# Patient Record
Sex: Female | Born: 1970 | Hispanic: No | Marital: Married | Smoking: Never smoker
Health system: Southern US, Community
[De-identification: ages and names within clinical notes are randomized; demographics above are authoritative.]

## PROBLEM LIST (undated history)

## (undated) DIAGNOSIS — E7800 Pure hypercholesterolemia, unspecified: Secondary | ICD-10-CM

## (undated) HISTORY — PX: MANDIBLE FRACTURE SURGERY: SHX706

## (undated) HISTORY — PX: KNEE SURGERY: SHX244

---

## 1998-06-21 ENCOUNTER — Ambulatory Visit (HOSPITAL_COMMUNITY): Admission: RE | Admit: 1998-06-21 | Discharge: 1998-06-21 | Payer: Self-pay | Admitting: Obstetrics & Gynecology

## 1998-06-28 ENCOUNTER — Ambulatory Visit (HOSPITAL_COMMUNITY): Admission: RE | Admit: 1998-06-28 | Discharge: 1998-06-28 | Payer: Self-pay | Admitting: Obstetrics & Gynecology

## 1999-02-13 ENCOUNTER — Other Ambulatory Visit: Admission: RE | Admit: 1999-02-13 | Discharge: 1999-02-13 | Payer: Self-pay | Admitting: Obstetrics & Gynecology

## 2001-09-30 ENCOUNTER — Other Ambulatory Visit: Admission: RE | Admit: 2001-09-30 | Discharge: 2001-09-30 | Payer: Self-pay | Admitting: Obstetrics & Gynecology

## 2002-12-16 ENCOUNTER — Inpatient Hospital Stay (HOSPITAL_COMMUNITY): Admission: AD | Admit: 2002-12-16 | Discharge: 2002-12-16 | Payer: Self-pay | Admitting: Obstetrics & Gynecology

## 2003-01-04 ENCOUNTER — Encounter (INDEPENDENT_AMBULATORY_CARE_PROVIDER_SITE_OTHER): Payer: Self-pay | Admitting: Specialist

## 2003-01-04 ENCOUNTER — Inpatient Hospital Stay (HOSPITAL_COMMUNITY): Admission: AD | Admit: 2003-01-04 | Discharge: 2003-01-08 | Payer: Self-pay | Admitting: Obstetrics and Gynecology

## 2003-02-16 ENCOUNTER — Other Ambulatory Visit: Admission: RE | Admit: 2003-02-16 | Discharge: 2003-02-16 | Payer: Self-pay | Admitting: Obstetrics and Gynecology

## 2007-01-22 ENCOUNTER — Other Ambulatory Visit: Admission: RE | Admit: 2007-01-22 | Discharge: 2007-01-22 | Payer: Self-pay | Admitting: *Deleted

## 2008-01-19 ENCOUNTER — Other Ambulatory Visit: Admission: RE | Admit: 2008-01-19 | Discharge: 2008-01-19 | Payer: Self-pay | Admitting: Family Medicine

## 2011-01-25 NOTE — Discharge Summary (Signed)
   Lindsay Kelly, DESCOTEAUX NO.:  [id number]   MEDICAL RECORD NO.:  [id number]                   PATIENT TYPE:  INP   LOCATION:  9124                                 FACILITY:  [facility]   PHYSICIAN:  Carrington Clamp, M.D.              DATE OF BIRTH:  08-May-1971   DATE OF ADMISSION:  01/04/2003  DATE OF DISCHARGE:  01/08/2003                                 DISCHARGE SUMMARY   FINAL DIAGNOSES:  1. Intrauterine pregnancy at 30-2/[redacted] weeks gestation.  2. Preterm labor.  3. Preterm premature rupture of membranes.  4. Double footling breech presentation.   PROCEDURE:  Classical cesarean section.  Surgeon: Miguel Aschoff, M.D.   COMPLICATIONS:  None.   HISTORY:  This 40 year old, G2, P0-0-1-0 presents to the [facility] on  April 27 with discharge, spotting, and pressure.  The patient was noted to  have spontaneous rupture of membranes and was found to have both feet  presenting in the vagina through the cervix which appeared to be about 6 cm  dilated.  The patient's antepartum course up to this point had been  uncomplicated.  She did have a non-immune rubella status, and she was Rh  negative and did receive her RhoGAM at 28 weeks.  Otherwise, her antepartum  course had been uncomplicated up to this point.   HOSPITAL COURSE:  She was taken for an immediate cesarean section.  The  patient was taken to the operating room on January 04, 2003, by Dr. Miguel Aschoff  where a classical cesarean section was performed with delivery of a 3 pound  8 ounce female infant with Apgars of 6 and 7.  Of course, NICU was at  delivery, and the baby was taken immediately to the NICU.   The patient's postoperative course was benign without significant fevers.  The patient did receive rubella vaccine during her hospital course and  RhoGAM.  The baby was in the NICU, stable and doing well, and she was  thought ready for discharge on postoperative day #3.   She was sent home on a regular  diet, told to decrease activities, told to  continue prenatal vitamins.  She was given a prescription for Percocet 1 to  2 every 4 hours as needed for pain, told she could use ibuprofen every 6  hours as needed, and follow up in the office in two weeks.  Of course, the  patient was to call for any fevers or pain.   DISCHARGE LABORATORY DATA:  The patient had a hemoglobin of 10.3 and  hematocrit 30.7 upon discharge.     Leilani Able, P.A.-C.                Carrington Clamp, M.D.    MB/MEDQ  D:  02/03/2003  T:  02/03/2003  Job:  [id number]

## 2011-01-25 NOTE — Op Note (Signed)
NAME:  Lindsay Kelly, HOHEISEL NO.:  [id number]   MEDICAL RECORD NO.:  [id number]                   PATIENT TYPE:  INP   LOCATION:  9124                                 FACILITY:  [facility]   PHYSICIAN:  Miguel Aschoff, M.D.                    DATE OF BIRTH:  1971-04-17   DATE OF PROCEDURE:  01/04/2003  DATE OF DISCHARGE:                                 OPERATIVE REPORT   PREOPERATIVE DIAGNOSES:  Intrauterine pregnancy at 30 and 2/7 weeks, preterm  labor, preterm rupture of membranes, double footling breech.   POSTOPERATIVE DIAGNOSES:  Intrauterine pregnancy at 30 and 2/7 weeks,  preterm labor, preterm rupture of membranes, double footling breech,  delivery of viable female infant.   PROCEDURE:  Classical Cesarean section.   SURGEON:  Miguel Aschoff, M.D.   ANESTHESIA:  Spinal.   COMPLICATIONS:  None.   JUSTIFICATION:  The patient is a 40 year-old white female gravida 2, para 0-  0-1-0 with an estimated date of confinement of 03/13/2003 who presented to  the [facility] with onset of lower abdominal pain, spotting and  discharge.  She was placed on monitor and was noted to be having regular  contractions.  On examination was found to have both feet presenting in the  vagina through a cervix which appeared to be approximately 6 cm dilated.  In  view of the preterm labor and preterm rupture of membranes, she was taken  for immediate Cesarean section.   DESCRIPTION OF PROCEDURE:  The patient was taken to the operating room and  placed in the sitting position.  Spinal anesthesia was administered without  difficulty.  She was then placed in supine position, deviated to the left  and prepped and draped in usual sterile fashion.  Pfannenstiel incision was  then made after a Foley catheter had been previously placed.  The incision  was extended down through the subcutaneous tissue with bleeding points being  clamped and then coagulated as they were encountered.  The  fascia was then  identified and incised transversely.  It was then separated from the  underlying rectus muscles.  Rectus muscles were divided in the midline.  Peritoneum was then identified and entered carefully avoiding underlying  structures.  Once this was done palpation was carried out.  It appeared that  the head and chest were in the fundus.  There appeared to be a contraction  band.  The buttocks were at the cervix and the feet were through the cervix.  In view of the bulk of the fetus being in the fundus and contraction band,  it was elected to proceed with a low vertical incision on the uterus.  Because of the contraction band and the inability to extract the fetus, the  incision was extended up to a classical incision with delivery of a viable  female infant.  The Apgars pending.  The neonatologists were in  attendance.  After delivery of the baby, the baby was handed to the neonatal team in  attendance.  Cord bloods were then obtained.  The placenta was delivered and  the uterus was closed in several layers.  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  Most of these were between 5.0 mm  and 2.0 cm in size but there were a multitude of these small fibroids  present.  Using #1 Vicryl the uterus was closed in three layers and then the  parietal peritoneum was reapproximated using running continuous 2-0 Vicryl  suture.  Hemostasis appeared to be excellent.  At this point, lap counts and  instrument counts were taken and found to be correct.  The tubes and ovaries  were inspected and noted to be within normal limits.  The abdomen was  irrigated with normal saline and then the abdomen was closed.  The parietal  peritoneum was closed using running continuous 2-0 Vicryl suture.  Rectus  muscles were reapproximated using running continuous 2-0 Vicryl suture.  The  fascia was closed using two sutures of 0 Vicryl starting at the lateral  fascial angles and  meeting in the midline.  The subcutaneous tissue and skin  were closed using staples.  The estimated blood loss was approximately 600  cc.  The patient tolerated the procedure well and went to the recovery room  in satisfactory condition.  The baby was taken to the Neonatal Intensive  Care Unit in satisfactory condition.                                               Miguel Aschoff, M.D.    AR/MEDQ  D:  01/05/2003  T:  01/05/2003  Job:  [id number]

## 2012-07-09 ENCOUNTER — Other Ambulatory Visit (HOSPITAL_COMMUNITY)
Admission: RE | Admit: 2012-07-09 | Discharge: 2012-07-09 | Disposition: A | Discharge status: Home / Self Care | Payer: 59 | Source: Ambulatory Visit | Attending: Family Medicine | Admitting: Family Medicine

## 2012-07-09 ENCOUNTER — Other Ambulatory Visit: Payer: Self-pay | Admitting: Physician Assistant

## 2012-07-09 DIAGNOSIS — Z124 Encounter for screening for malignant neoplasm of cervix: Secondary | ICD-10-CM | POA: Insufficient documentation

## 2013-07-12 ENCOUNTER — Other Ambulatory Visit (HOSPITAL_COMMUNITY)
Admission: RE | Admit: 2013-07-12 | Discharge: 2013-07-12 | Disposition: A | Discharge status: Home / Self Care | Payer: 59 | Source: Ambulatory Visit | Attending: Family Medicine | Admitting: Family Medicine

## 2013-07-12 ENCOUNTER — Other Ambulatory Visit: Payer: Self-pay | Admitting: Physician Assistant

## 2013-07-12 ENCOUNTER — Other Ambulatory Visit: Payer: Self-pay

## 2013-07-12 DIAGNOSIS — Z124 Encounter for screening for malignant neoplasm of cervix: Secondary | ICD-10-CM | POA: Insufficient documentation

## 2013-07-12 DIAGNOSIS — Z1231 Encounter for screening mammogram for malignant neoplasm of breast: Secondary | ICD-10-CM

## 2013-08-10 ENCOUNTER — Ambulatory Visit
Admission: RE | Admit: 2013-08-10 | Discharge: 2013-08-10 | Disposition: A | Discharge status: Home / Self Care | Payer: 59 | Source: Ambulatory Visit

## 2013-08-10 DIAGNOSIS — Z1231 Encounter for screening mammogram for malignant neoplasm of breast: Secondary | ICD-10-CM

## 2013-08-11 ENCOUNTER — Other Ambulatory Visit: Payer: Self-pay | Admitting: Physician Assistant

## 2013-08-11 DIAGNOSIS — R928 Other abnormal and inconclusive findings on diagnostic imaging of breast: Secondary | ICD-10-CM

## 2013-08-18 ENCOUNTER — Ambulatory Visit
Admission: RE | Admit: 2013-08-18 | Discharge: 2013-08-18 | Disposition: A | Discharge status: Home / Self Care | Payer: 59 | Source: Ambulatory Visit | Attending: Physician Assistant | Admitting: Physician Assistant

## 2013-08-18 DIAGNOSIS — R928 Other abnormal and inconclusive findings on diagnostic imaging of breast: Secondary | ICD-10-CM

## 2014-07-11 ENCOUNTER — Other Ambulatory Visit: Payer: Self-pay

## 2014-07-11 DIAGNOSIS — Z1231 Encounter for screening mammogram for malignant neoplasm of breast: Secondary | ICD-10-CM

## 2014-07-15 ENCOUNTER — Other Ambulatory Visit (HOSPITAL_COMMUNITY)
Admission: RE | Admit: 2014-07-15 | Discharge: 2014-07-15 | Disposition: A | Discharge status: Home / Self Care | Payer: 59 | Source: Ambulatory Visit | Attending: Family Medicine | Admitting: Family Medicine

## 2014-07-15 ENCOUNTER — Other Ambulatory Visit: Payer: Self-pay | Admitting: Physician Assistant

## 2014-07-15 DIAGNOSIS — Z124 Encounter for screening for malignant neoplasm of cervix: Secondary | ICD-10-CM | POA: Diagnosis present

## 2014-07-19 LAB — CYTOLOGY - PAP

## 2014-08-11 ENCOUNTER — Ambulatory Visit
Admission: RE | Admit: 2014-08-11 | Discharge: 2014-08-11 | Disposition: A | Discharge status: Home / Self Care | Payer: 59 | Source: Ambulatory Visit

## 2014-08-11 DIAGNOSIS — Z1231 Encounter for screening mammogram for malignant neoplasm of breast: Secondary | ICD-10-CM

## 2015-08-14 ENCOUNTER — Other Ambulatory Visit: Payer: Self-pay

## 2015-08-14 DIAGNOSIS — Z1231 Encounter for screening mammogram for malignant neoplasm of breast: Secondary | ICD-10-CM

## 2015-08-18 ENCOUNTER — Ambulatory Visit
Admission: RE | Admit: 2015-08-18 | Discharge: 2015-08-18 | Disposition: A | Discharge status: Home / Self Care | Payer: Commercial Managed Care - HMO | Source: Ambulatory Visit

## 2015-08-18 DIAGNOSIS — Z1231 Encounter for screening mammogram for malignant neoplasm of breast: Secondary | ICD-10-CM

## 2016-08-21 ENCOUNTER — Other Ambulatory Visit: Payer: Self-pay | Admitting: Physician Assistant

## 2016-08-21 DIAGNOSIS — Z1231 Encounter for screening mammogram for malignant neoplasm of breast: Secondary | ICD-10-CM

## 2016-09-23 ENCOUNTER — Ambulatory Visit
Admission: RE | Admit: 2016-09-23 | Discharge: 2016-09-23 | Disposition: A | Discharge status: Home / Self Care | Payer: Commercial Managed Care - HMO | Source: Ambulatory Visit | Attending: Physician Assistant | Admitting: Physician Assistant

## 2016-09-23 DIAGNOSIS — Z1231 Encounter for screening mammogram for malignant neoplasm of breast: Secondary | ICD-10-CM | POA: Diagnosis not present

## 2017-08-28 ENCOUNTER — Other Ambulatory Visit: Payer: Self-pay | Admitting: Physician Assistant

## 2017-08-28 DIAGNOSIS — Z1231 Encounter for screening mammogram for malignant neoplasm of breast: Secondary | ICD-10-CM

## 2017-09-26 ENCOUNTER — Ambulatory Visit
Admission: RE | Admit: 2017-09-26 | Discharge: 2017-09-26 | Disposition: A | Discharge status: Home / Self Care | Payer: BC Managed Care – PPO | Source: Ambulatory Visit | Attending: Physician Assistant | Admitting: Physician Assistant

## 2017-09-26 DIAGNOSIS — Z1231 Encounter for screening mammogram for malignant neoplasm of breast: Secondary | ICD-10-CM

## 2017-10-09 ENCOUNTER — Other Ambulatory Visit: Payer: Self-pay | Admitting: Physician Assistant

## 2017-10-09 ENCOUNTER — Other Ambulatory Visit (HOSPITAL_COMMUNITY)
Admission: RE | Admit: 2017-10-09 | Discharge: 2017-10-09 | Disposition: A | Discharge status: Home / Self Care | Payer: BC Managed Care – PPO | Source: Ambulatory Visit | Attending: Physician Assistant | Admitting: Physician Assistant

## 2017-10-09 DIAGNOSIS — Z124 Encounter for screening for malignant neoplasm of cervix: Secondary | ICD-10-CM | POA: Insufficient documentation

## 2017-10-15 LAB — CYTOLOGY - PAP
Diagnosis: UNDETERMINED — AB
HPV: NOT DETECTED

## 2018-10-30 DIAGNOSIS — S02609A Fracture of mandible, unspecified, initial encounter for closed fracture: Secondary | ICD-10-CM | POA: Insufficient documentation

## 2020-04-11 ENCOUNTER — Other Ambulatory Visit: Payer: Self-pay | Admitting: Physician Assistant

## 2020-04-11 DIAGNOSIS — Z1231 Encounter for screening mammogram for malignant neoplasm of breast: Secondary | ICD-10-CM

## 2020-04-17 ENCOUNTER — Ambulatory Visit
Admission: RE | Admit: 2020-04-17 | Discharge: 2020-04-17 | Disposition: A | Discharge status: Home / Self Care | Payer: BC Managed Care – PPO | Source: Ambulatory Visit | Attending: Physician Assistant | Admitting: Physician Assistant

## 2020-04-17 ENCOUNTER — Other Ambulatory Visit: Payer: Self-pay

## 2020-04-17 DIAGNOSIS — Z1231 Encounter for screening mammogram for malignant neoplasm of breast: Secondary | ICD-10-CM

## 2020-10-26 ENCOUNTER — Other Ambulatory Visit: Payer: Self-pay | Admitting: Sports Medicine

## 2020-10-26 ENCOUNTER — Ambulatory Visit (INDEPENDENT_AMBULATORY_CARE_PROVIDER_SITE_OTHER): Payer: 59

## 2020-10-26 ENCOUNTER — Other Ambulatory Visit: Payer: Self-pay

## 2020-10-26 ENCOUNTER — Ambulatory Visit (INDEPENDENT_AMBULATORY_CARE_PROVIDER_SITE_OTHER): Payer: 59 | Admitting: Sports Medicine

## 2020-10-26 ENCOUNTER — Encounter: Payer: Self-pay | Admitting: Sports Medicine

## 2020-10-26 DIAGNOSIS — M6740 Ganglion, unspecified site: Secondary | ICD-10-CM

## 2020-10-26 DIAGNOSIS — M79672 Pain in left foot: Secondary | ICD-10-CM

## 2020-10-26 DIAGNOSIS — M67472 Ganglion, left ankle and foot: Secondary | ICD-10-CM | POA: Diagnosis not present

## 2020-10-26 DIAGNOSIS — E7800 Pure hypercholesterolemia, unspecified: Secondary | ICD-10-CM | POA: Insufficient documentation

## 2020-10-26 DIAGNOSIS — M7989 Other specified soft tissue disorders: Secondary | ICD-10-CM

## 2020-10-26 NOTE — Progress Notes (Signed)
Subjective: Lindsay Kelly is a 50 y.o. female patient who presents to office for evaluation of Left foot pain. Patient complains of irritation with shoes at the left foot and that her husband noticed it back in Dec. Patient denies any other pedal complaints. Denies injury/trip/fall/sprain/any causative factors.   Review of Systems  All other systems reviewed and are negative.    There are no problems to display for this patient.   No current outpatient medications on file prior to visit.   No current facility-administered medications on file prior to visit.    No Known Allergies  Objective:  General: Alert and oriented x3 in no acute distress  Dermatology: Raised mobile soft tissue mass at dorsolateral left foot that measures 1x2cm, No open lesions bilateral lower extremities, no webspace macerations, no ecchymosis bilateral, all nails x 10 are well manicured.  Vascular: Dorsalis Pedis and Posterior Tibial pedal pulses palpable, Capillary Fill Time 3 seconds,(+) pedal hair growth bilateral, no edema bilateral lower extremities, Temperature gradient within normal limits.  Neurology: Michaell Cowing sensation intact via light touch bilateral, vibratory intact bilateral.   Musculoskeletal: Minimal tenderness with palpation at dorsal midfoot on the left, No other symptomatic pedal deformities, Strength within normal limits in all groups bilateral.   Gait: Antalgic gait  Xrays  Left Foot   Impression:No acute osseous findings at area of concern.  Assessment and Plan: Problem List Items Addressed This Visit   None   Visit Diagnoses    Pain in left foot    -  Primary   Relevant Orders   DG Foot Complete Left   Soft tissue mass       Ganglion           -Complete examination performed -Xrays reviewed -Discussed treatment options -Patient elects for aspiration. After sterile prep, local anesthestic was administered to the area using 3cc 1% lidocaine and 0.5% marcaine plain after  anesthesia was confirmed using a 18g need aspirated 1cc of clear fluid from the mass/cyst and then injected 0.5cc of dexamethasone and then dressed with antibioitic cream and coban compressive dressing, patient to do the same daily for the next 3 days until site is healed. Patient tolerated the aspiration well without issues.  -Continue with good supportive shoes daily -Patient to return to office 2 weeks for site check or sooner if condition worsens.  Asencion Islam, DPM

## 2020-11-09 ENCOUNTER — Other Ambulatory Visit: Payer: Self-pay

## 2020-11-09 ENCOUNTER — Encounter: Payer: Self-pay | Admitting: Sports Medicine

## 2020-11-09 ENCOUNTER — Ambulatory Visit: Payer: 59 | Admitting: Sports Medicine

## 2020-11-09 DIAGNOSIS — M79672 Pain in left foot: Secondary | ICD-10-CM

## 2020-11-09 DIAGNOSIS — M7989 Other specified soft tissue disorders: Secondary | ICD-10-CM

## 2020-11-09 DIAGNOSIS — M6740 Ganglion, unspecified site: Secondary | ICD-10-CM

## 2020-11-09 NOTE — Progress Notes (Signed)
Subjective: Lindsay Kelly is a 50 y.o. female patient seen today in office for aspiration site check.  Patient had an office aspiration on 10/26/2020 of cyst at the dorsal lateral left foot.  Patient reports that there is no pain the cyst has slowly come back but not as big as before and only causes pain with wearing certain shoes.  No other issues noted.   Patient Active Problem List   Diagnosis Date Noted  . Pure hypercholesterolemia 10/26/2020  . Mandible fracture (HCC) 10/30/2018    Current Outpatient Medications on File Prior to Visit  Medication Sig Dispense Refill  . acetaminophen (TYLENOL) 500 MG tablet Take by mouth.    . fluticasone (FLONASE) 50 MCG/ACT nasal spray 1 spray in each nostril     No current facility-administered medications on file prior to visit.    No Known Allergies  Objective: There were no vitals filed for this visit.  General: No acute distress, AAOx3  Left foot previous puncture wound has well healed no warmth no redness no erythema no drainage no signs of infection there is still a small cyst noted at the dorsal lateral left foot that is well circumcised suggestive of reaccumulation and likely that this is may be related from the tendon, no limitation with range of motion neurovascular status intact.   Assessment and Plan:  Problem List Items Addressed This Visit   None   Visit Diagnoses    Soft tissue mass    -  Primary   Ganglion       Pain in left foot           -Patient seen and evaluated -Previous aspiration site on left foot well-healed -Advised patient to continue to monitor cyst if reaccumulate then may benefit from surgical excision -May also use Coban for gentle compression as directed -Return to office as needed or sooner if problem recurs or worsens  Asencion Islam, DPM

## 2021-03-29 ENCOUNTER — Other Ambulatory Visit: Payer: Self-pay | Admitting: Physician Assistant

## 2021-03-29 DIAGNOSIS — Z1231 Encounter for screening mammogram for malignant neoplasm of breast: Secondary | ICD-10-CM

## 2021-04-18 ENCOUNTER — Ambulatory Visit
Admission: RE | Admit: 2021-04-18 | Discharge: 2021-04-18 | Disposition: A | Discharge status: Home / Self Care | Payer: 59 | Source: Ambulatory Visit | Attending: Physician Assistant | Admitting: Physician Assistant

## 2021-04-18 ENCOUNTER — Other Ambulatory Visit: Payer: Self-pay

## 2021-04-18 DIAGNOSIS — Z1231 Encounter for screening mammogram for malignant neoplasm of breast: Secondary | ICD-10-CM

## 2021-12-28 ENCOUNTER — Ambulatory Visit (HOSPITAL_COMMUNITY)
Admission: EM | Admit: 2021-12-28 | Discharge: 2021-12-28 | Disposition: A | Discharge status: Home / Self Care | Payer: 59 | Attending: Nurse Practitioner | Admitting: Nurse Practitioner

## 2021-12-28 ENCOUNTER — Encounter (HOSPITAL_COMMUNITY): Payer: Self-pay | Admitting: Emergency Medicine

## 2021-12-28 DIAGNOSIS — S80862A Insect bite (nonvenomous), left lower leg, initial encounter: Secondary | ICD-10-CM | POA: Diagnosis not present

## 2021-12-28 DIAGNOSIS — W57XXXA Bitten or stung by nonvenomous insect and other nonvenomous arthropods, initial encounter: Secondary | ICD-10-CM | POA: Diagnosis not present

## 2021-12-28 MED ORDER — AMOXICILLIN-POT CLAVULANATE 875-125 MG PO TABS: 1.0000 | ORAL_TABLET | Freq: Two times a day (BID) | ORAL | 0 refills | Status: AC

## 2021-12-28 NOTE — ED Triage Notes (Signed)
Pt reports an insect bite on Sunday. States she was bitten by what she thought was a [employer]". States the bite has been swollen, inflamed and "hard" also reports painful to touch.  ?

## 2021-12-28 NOTE — Discharge Instructions (Addendum)
-   I do not appreciate any signs of infection on your exam today ?-If you start developing signs of infection like warmth, increased erythema, swelling, please start the Augmentin (antibiotic). ?- If your symptoms worsen despite this treatment, please follow up with primary care or here in urgent care ?

## 2021-12-28 NOTE — ED Provider Notes (Signed)
?[facility] ? ? ? ?CSN: [id number] ?Arrival date & time: 12/28/21  1658 ? ? ?  ? ?History   ?Chief Complaint ?Chief Complaint  ?Patient presents with  ? Insect Bite  ? ? ?HPI ?Lindsay Kelly is a 51 y.o. female.  ? ?Patient presents with a red and painful area to her left posterior calf that has been present since Sunday.  She reports initially, she thought it was a chigger bite because it was itchy and weeping a little bit.  However, she does not have any other bites in that area is no longer itching.  She reports area is red in the middle of it is firm.  She denies any current itching, swelling, drainage.  Denies fevers, nausea/vomiting.  She has been using over-the-counter hydrocortisone ointment and covering it with a Band-Aid. ? ?She was out in the yard gardening over the weekend, however never saw a bug on her skin. ? ? ?History reviewed. No pertinent past medical history. ? ?Patient Active Problem List  ? Diagnosis Date Noted  ? Pure hypercholesterolemia 10/26/2020  ? Mandible fracture (HCC) 10/30/2018  ? ? ?History reviewed. No pertinent surgical history. ? ?OB History   ?No obstetric history on file. ?  ? ? ? ?Home Medications   ? ?Prior to Admission medications   ?Medication Sig Start Date End Date Taking? Authorizing Provider  ?amoxicillin-clavulanate (AUGMENTIN) 875-125 MG tablet Take 1 tablet by mouth 2 (two) times daily for 7 days. 12/28/21 01/04/22 Yes Valentino Nose, NP  ?acetaminophen (TYLENOL) 500 MG tablet Take by mouth.    [provider]  ?fluticasone Aleda Grana) 50 MCG/ACT nasal spray 1 spray in each nostril 06/07/14   [provider]  ? ? ?Family History ?History reviewed. No pertinent family history. ? ?Social History ?Social History  ? ?Tobacco Use  ? Smoking status: Never  ? Smokeless tobacco: Never  ? ? ? ?Allergies   ?Patient has no known allergies. ? ? ?Review of Systems ?Review of Systems ?Per HPI ? ?Physical Exam ?Triage Vital Signs ?ED Triage Vitals   ?Enc Vitals Group  ?   BP --   ?   Pulse Rate 12/28/21 1727 75  ?   Resp 12/28/21 1727 16  ?   Temp 12/28/21 1727 98.9 ?F (37.2 ?C)  ?   Temp Source 12/28/21 1727 Oral  ?   SpO2 12/28/21 1727 96 %  ?   Weight 12/28/21 1724 230 lb (104.3 kg)  ?   Height 12/28/21 1724 5\' 9"  (1.753 m)  ?   Head Circumference --   ?   Peak Flow --   ?   Pain Score 12/28/21 1724 0  ?   Pain Loc --   ?   Pain Edu? --   ?   Excl. in GC? --   ? ?No data found. ? ?Updated Vital Signs ?Pulse 75   Temp 98.9 ?F (37.2 ?C) (Oral)   Resp 16   Ht 5\' 9"  (1.753 m)   Wt 230 lb (104.3 kg)   SpO2 96%   BMI 33.97 kg/m?  ? ?Visual Acuity ?Right Eye Distance:   ?Left Eye Distance:   ?Bilateral Distance:   ? ?Right Eye Near:   ?Left Eye Near:    ?Bilateral Near:    ? ?Physical Exam ?Vitals and nursing note reviewed.  ?Constitutional:   ?   General: She is not in acute distress. ?   Appearance: Normal appearance. She is not toxic-appearing.  ?Pulmonary:  ?  Effort: Pulmonary effort is normal. No respiratory distress.  ?Skin: ?   General: Skin is warm and dry.  ?   Capillary Refill: Capillary refill takes less than 2 seconds.  ?   Findings: Erythema present.  ? ?    ?   Comments: Approximately 1 cm x 2.5 cm circular, slightly erythematous macule to posterior left calf in picture as diagrammed above.  No warmth, fluctuance, tenderness to palpation, drainage.  The middle of the macule is reddened.  ?Neurological:  ?   Mental Status: She is alert and oriented to person, place, and time.  ? ? ? ? ? ?UC Treatments / Results  ?Labs ?(all labs ordered are listed, but only abnormal results are displayed) ?Labs Reviewed - No data to display ? ?EKG ? ? ?Radiology ?No results found. ? ?Procedures ?Procedures (including critical care time) ? ?Medications Ordered in UC ?Medications - No data to display ? ?Initial Impression / Assessment and Plan / UC Course  ?I have reviewed the triage vital signs and the nursing notes. ? ?Pertinent labs & imaging results that  were available during my care of the patient were reviewed by me and considered in my medical decision making (see chart for details). ? ?  ?Suspect bug bite as cause of reddened area.  There is no itching, warmth, drainage, or fluctuance today, therefore I do not suspect any infection.  However, if symptoms worsen or signs of infection develop, I have given patient a prescription for Augmentin to start.  I encouraged her to leave the area open to air over the weekend and monitor for improvement.  Follow-up with primary care or back here in urgent care if symptoms do not improve despite tincture of time and Augmentin. ?Final Clinical Impressions(s) / UC Diagnoses  ? ?Final diagnoses:  ?Insect bite of left lower leg, initial encounter  ? ? ? ?Discharge Instructions   ? ?  ?- I do not appreciate any signs of infection on your exam today ?-If you start developing signs of infection like warmth, increased erythema, swelling, please start the Augmentin (antibiotic). ?- If your symptoms worsen despite this treatment, please follow up with primary care or here in urgent care ? ? ? ? ?ED Prescriptions   ? ? Medication Sig Dispense Auth. Provider  ? amoxicillin-clavulanate (AUGMENTIN) 875-125 MG tablet Take 1 tablet by mouth 2 (two) times daily for 7 days. 14 tablet Valentino Nose, NP  ? ?  ? ?PDMP not reviewed this encounter. ?  ?Valentino Nose, NP ?12/28/21 1751 ? ?

## 2022-03-19 ENCOUNTER — Other Ambulatory Visit: Payer: Self-pay | Admitting: Physician Assistant

## 2022-03-19 DIAGNOSIS — Z1231 Encounter for screening mammogram for malignant neoplasm of breast: Secondary | ICD-10-CM

## 2022-04-19 ENCOUNTER — Ambulatory Visit
Admission: RE | Admit: 2022-04-19 | Discharge: 2022-04-19 | Disposition: A | Discharge status: Home / Self Care | Payer: 59 | Source: Ambulatory Visit | Attending: Physician Assistant | Admitting: Physician Assistant

## 2022-04-19 DIAGNOSIS — Z1231 Encounter for screening mammogram for malignant neoplasm of breast: Secondary | ICD-10-CM

## 2022-07-10 ENCOUNTER — Encounter (HOSPITAL_COMMUNITY): Payer: Self-pay | Admitting: Emergency Medicine

## 2022-07-10 ENCOUNTER — Ambulatory Visit (HOSPITAL_COMMUNITY)
Admission: EM | Admit: 2022-07-10 | Discharge: 2022-07-10 | Disposition: A | Discharge status: Home / Self Care | Payer: 59

## 2022-07-10 DIAGNOSIS — J209 Acute bronchitis, unspecified: Secondary | ICD-10-CM

## 2022-07-10 HISTORY — DX: Pure hypercholesterolemia, unspecified: E78.00

## 2022-07-10 MED ORDER — ACETAMINOPHEN 325 MG PO TABS
975.0000 mg | ORAL_TABLET | Freq: Once | ORAL | Status: AC
  Administered 2022-07-10: 975 mg via ORAL

## 2022-07-10 MED ORDER — ALBUTEROL SULFATE HFA 108 (90 BASE) MCG/ACT IN AERS
INHALATION_SPRAY | RESPIRATORY_TRACT | Status: AC
  Filled 2022-07-10: qty 6.7

## 2022-07-10 MED ORDER — BENZONATATE 100 MG PO CAPS: 100.0000 mg | ORAL_CAPSULE | Freq: Three times a day (TID) | ORAL | 0 refills | Status: AC

## 2022-07-10 MED ORDER — PREDNISONE 20 MG PO TABS
40.0000 mg | ORAL_TABLET | Freq: Once | ORAL | Status: AC
  Administered 2022-07-10: 40 mg via ORAL

## 2022-07-10 MED ORDER — ALBUTEROL SULFATE HFA 108 (90 BASE) MCG/ACT IN AERS: 1.0000 | INHALATION_SPRAY | Freq: Four times a day (QID) | RESPIRATORY_TRACT | 0 refills | Status: AC | PRN

## 2022-07-10 MED ORDER — ALBUTEROL SULFATE HFA 108 (90 BASE) MCG/ACT IN AERS
2.0000 | INHALATION_SPRAY | Freq: Once | RESPIRATORY_TRACT | Status: AC
  Administered 2022-07-10: 2 via RESPIRATORY_TRACT

## 2022-07-10 MED ORDER — PREDNISONE 20 MG PO TABS
ORAL_TABLET | ORAL | Status: AC
  Filled 2022-07-10: qty 2

## 2022-07-10 MED ORDER — ACETAMINOPHEN 325 MG PO TABS
ORAL_TABLET | ORAL | Status: AC
  Filled 2022-07-10: qty 3

## 2022-07-10 MED ORDER — GUAIFENESIN ER 1200 MG PO TB12: 1200.0000 mg | ORAL_TABLET | Freq: Two times a day (BID) | ORAL | 0 refills | Status: AC

## 2022-07-10 MED ORDER — PREDNISONE 20 MG PO TABS: 40.0000 mg | ORAL_TABLET | Freq: Every day | ORAL | 0 refills | Status: AC

## 2022-07-10 NOTE — ED Provider Notes (Signed)
[place]    CSN: [id number] Arrival date & time: 07/10/22  1116      History   Chief Complaint No chief complaint on file.   HPI Lindsay Kelly is a 51 y.o. female.   Patient presents to urgent care for evaluation of cough, nasal congestion, fatigue, and headache that started 6 ago on Friday, July 05, 2022. She began to hear wheezing yesterday after coughing and cough has been persistent. Cough is mostly dry and sometimes productive. Reports generalized headache that is worsened by coughing and currently a 6 on a scale of 0-10. Denies sore throat, ear pain, chest pain, shortness of breath, weakness, blurry vision, and dizziness. No fever/chills, abdominal pain, nausea, or vomiting reported. Cough is much worse at nighttime. She is not a smoker and denies drug use.  Denies history of chronic respiratory problems.  No known sick contacts with similar symptoms. She took 2 home COVID-19 tests yesterday and both were negative. She has been using over the counter cough and cold medicines prior to arrival at urgent care for symptoms without relief.      Past Medical History:  Diagnosis Date   High cholesterol     Patient Active Problem List   Diagnosis Date Noted   Pure hypercholesterolemia 10/26/2020   Mandible fracture ([place]) 10/30/2018    History reviewed. No pertinent surgical history.  OB History   No obstetric history on file.      Home Medications    Prior to Admission medications   Medication Sig Start Date End Date Taking? Authorizing Provider  albuterol (VENTOLIN HFA) 108 (90 Base) MCG/ACT inhaler Inhale 1-2 puffs into the lungs every 6 (six) hours as needed for wheezing or shortness of breath. 07/10/22  Yes Talbot Grumbling, FNP  benzonatate (TESSALON) 100 MG capsule Take 1 capsule (100 mg total) by mouth every 8 (eight) hours. 07/10/22  Yes Talbot Grumbling, FNP  fluticasone (FLONASE) 50 MCG/ACT nasal spray 1 spray in each nostril  06/07/14  Yes [provider]  Guaifenesin 1200 MG TB12 Take 1 tablet (1,200 mg total) by mouth in the morning and at bedtime. 07/10/22  Yes Talbot Grumbling, FNP  predniSONE (DELTASONE) 20 MG tablet Take 2 tablets (40 mg total) by mouth daily for 4 days. 07/10/22 07/14/22 Yes Talbot Grumbling, FNP  acetaminophen (TYLENOL) 500 MG tablet Take by mouth.    [provider]  atorvastatin (LIPITOR) 10 MG tablet Take 10 mg by mouth daily. 06/18/22   [provider]    Family History No family history on file.  Social History Social History   Tobacco Use   Smoking status: Never   Smokeless tobacco: Never     Allergies   Patient has no known allergies.   Review of Systems Review of Systems Per HPI  Physical Exam Triage Vital Signs ED Triage Vitals  Enc Vitals Group     BP 07/10/22 1301 (!) 142/103     Pulse Rate 07/10/22 1301 88     Resp 07/10/22 1301 16     Temp 07/10/22 1301 98.3 F (36.8 C)     Temp Source 07/10/22 1301 Oral     SpO2 07/10/22 1301 99 %     Weight --      Height --      Head Circumference --      Peak Flow --      Pain Score 07/10/22 1300 6     Pain Loc --  Pain Edu? --      Excl. in GC? --    No data found.  Updated Vital Signs BP (!) 142/103 (BP Location: Right Arm)   Pulse 88   Temp 98.3 F (36.8 C) (Oral)   Resp 16   SpO2 99%   Visual Acuity Right Eye Distance:   Left Eye Distance:   Bilateral Distance:    Right Eye Near:   Left Eye Near:    Bilateral Near:     Physical Exam Vitals and nursing note reviewed.  Constitutional:      Appearance: She is not ill-appearing or toxic-appearing.  HENT:     Head: Normocephalic and atraumatic.     Right Ear: Hearing, tympanic membrane, ear canal and external ear normal.     Left Ear: Hearing, tympanic membrane, ear canal and external ear normal.     Nose: Nose normal.     Mouth/Throat:     Lips: Pink.  Eyes:     General: Lids are normal. Vision  grossly intact. Gaze aligned appropriately.     Extraocular Movements: Extraocular movements intact.     Conjunctiva/sclera: Conjunctivae normal.  Cardiovascular:     Rate and Rhythm: Normal rate and regular rhythm.     Heart sounds: Normal heart sounds, S1 normal and S2 normal.  Pulmonary:     Effort: Pulmonary effort is normal. No respiratory distress.     Breath sounds: Normal air entry. Wheezing present. No rhonchi or rales.     Comments: Diffuse faint expiratory wheezing to the bilateral lower lung fields and left middle lobe. Chest:     Chest wall: No tenderness.  Musculoskeletal:     Cervical back: Neck supple.  Skin:    General: Skin is warm and dry.     Capillary Refill: Capillary refill takes less than 2 seconds.     Findings: No rash.  Neurological:     General: No focal deficit present.     Mental Status: She is alert and oriented to person, place, and time. Mental status is at baseline.     Cranial Nerves: No dysarthria or facial asymmetry.  Psychiatric:        Mood and Affect: Mood normal.        Speech: Speech normal.        Behavior: Behavior normal.        Thought Content: Thought content normal.        Judgment: Judgment normal.      UC Treatments / Results  Labs (all labs ordered are listed, but only abnormal results are displayed) Labs Reviewed - No data to display  EKG   Radiology No results found.  Procedures Procedures (including critical care time)  Medications Ordered in UC Medications  albuterol (VENTOLIN HFA) 108 (90 Base) MCG/ACT inhaler 2 puff (has no administration in time range)  predniSONE (DELTASONE) tablet 40 mg (40 mg Oral Given 07/10/22 1338)  acetaminophen (TYLENOL) tablet 975 mg (975 mg Oral Given 07/10/22 1338)    Initial Impression / Assessment and Plan / UC Course  I have reviewed the triage vital signs and the nursing notes.  Pertinent labs & imaging results that were available during my care of the patient were reviewed  by me and considered in my medical decision making (see chart for details).   1. Acute bronchitis Symptoms and physical exam consistent with acute bronchitis etiology.  Prednisone 40 mg dose given in clinic to help reduce inflammation along with 2 puffs of albuterol  to help with wheezing and cough.  She may use albuterol every 4-6 hours at home as needed for cough, shortness of breath, and wheeze.  Tylenol given for headache associated with coughing.  Prednisone burst prescribed for the next 4 days at home 40 mg once daily with breakfast.  Advised to avoid NSAID therapy while taking prednisone due to increased risk of GI bleeding.  Mucinex may be used to help thin mucus and Tessalon Perles may be used to help with cough during the daytime.  She may continue using NyQuil at home as needed for cough at nighttime.  Strict ER return precautions given.  Deferred imaging based on stable cardiopulmonary exam and hemodynamically stable vital signs.   Discussed physical exam and available lab work findings in clinic with patient.  Counseled patient regarding appropriate use of medications and potential side effects for all medications recommended or prescribed today. Discussed red flag signs and symptoms of worsening condition,when to call the PCP office, return to urgent care, and when to seek higher level of care in the emergency department. Patient verbalizes understanding and agreement with plan. All questions answered. Patient discharged in stable condition.   Final Clinical Impressions(s) / UC Diagnoses   Final diagnoses:  Acute bronchitis, unspecified organism     Discharge Instructions      You have bronchitis causing your cough, shortness of breath, and wheezing.   Take prednisone 40 mg by mouth every morning with breakfast for the next 4 days starting tomorrow.  Do not take any ibuprofen while you are taking prednisone.  Take tessalon pearles every 8 hours as needed for cough.  You may  take Tylenol as needed for headache, fever/chills, or any other aches or pains associated with the bronchitis.  You may use albuterol inhaler every 4-6 hours 1 to 2 puffs to help with cough, shortness of breath, and wheeze.  You may do Mucinex 1200 mg every 12 hours as needed for stuffy nose and cough.  This will help to thin the mucus so that you are able to cough it up and blow it out of your nose easier.  If you develop any new or worsening symptoms or do not improve in the next 2 to 3 days, please return.  If your symptoms are severe, please go to the emergency room.  Follow-up with your primary care provider for further evaluation and management of your symptoms as well as ongoing wellness visits.  I hope you feel better!   ED Prescriptions     Medication Sig Dispense Auth. Provider   Guaifenesin 1200 MG TB12 Take 1 tablet (1,200 mg total) by mouth in the morning and at bedtime. 14 tablet Reita May M, FNP   predniSONE (DELTASONE) 20 MG tablet Take 2 tablets (40 mg total) by mouth daily for 4 days. 8 tablet Carlisle Beers, FNP   albuterol (VENTOLIN HFA) 108 (90 Base) MCG/ACT inhaler Inhale 1-2 puffs into the lungs every 6 (six) hours as needed for wheezing or shortness of breath. 8 g Reita May M, FNP   benzonatate (TESSALON) 100 MG capsule Take 1 capsule (100 mg total) by mouth every 8 (eight) hours. 21 capsule Carlisle Beers, FNP      PDMP not reviewed this encounter.   Carlisle Beers, [place] 07/10/22 1359

## 2022-07-10 NOTE — ED Triage Notes (Signed)
Headache, drainage, sore throat, cough that started on Friday. Reports had some wheezing so why came in to be seen. Taking Nyquil and dayquil for symptoms.

## 2022-07-10 NOTE — Discharge Instructions (Addendum)
You have bronchitis causing your cough, shortness of breath, and wheezing.   Take prednisone 40 mg by mouth every morning with breakfast for the next 4 days starting tomorrow.  Do not take any ibuprofen while you are taking prednisone.  Take tessalon pearles every 8 hours as needed for cough.  You may take Tylenol as needed for headache, fever/chills, or any other aches or pains associated with the bronchitis.  You may use albuterol inhaler every 4-6 hours 1 to 2 puffs to help with cough, shortness of breath, and wheeze.  You may do Mucinex 1200 mg every 12 hours as needed for stuffy nose and cough.  This will help to thin the mucus so that you are able to cough it up and blow it out of your nose easier.  If you develop any new or worsening symptoms or do not improve in the next 2 to 3 days, please return.  If your symptoms are severe, please go to the emergency room.  Follow-up with your primary care provider for further evaluation and management of your symptoms as well as ongoing wellness visits.  I hope you feel better!

## 2022-08-25 IMAGING — MG MM DIGITAL SCREENING BILAT W/ TOMO AND CAD
6 of 10 series · 6 of 30 positions shown · non-contrast
Comparison: Previous exam(s).

CLINICAL DATA: Screening.

EXAM:
DIGITAL SCREENING BILATERAL MAMMOGRAM WITH TOMOSYNTHESIS AND CAD
TECHNIQUE: Bilateral screening digital craniocaudal and mediolateral oblique
mammograms were obtained. Bilateral screening digital breast
tomosynthesis was performed. The images were evaluated with
computer-aided detection.

[L MLO synth-2D]
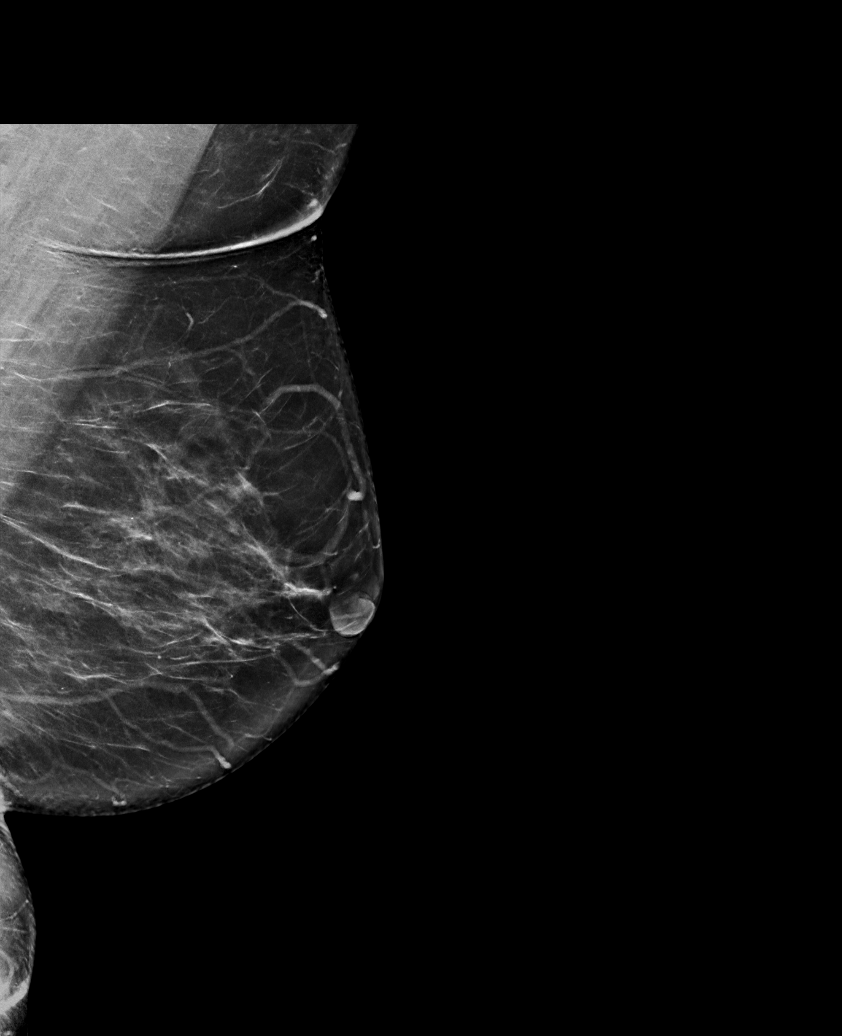

[R CC synth-2D]
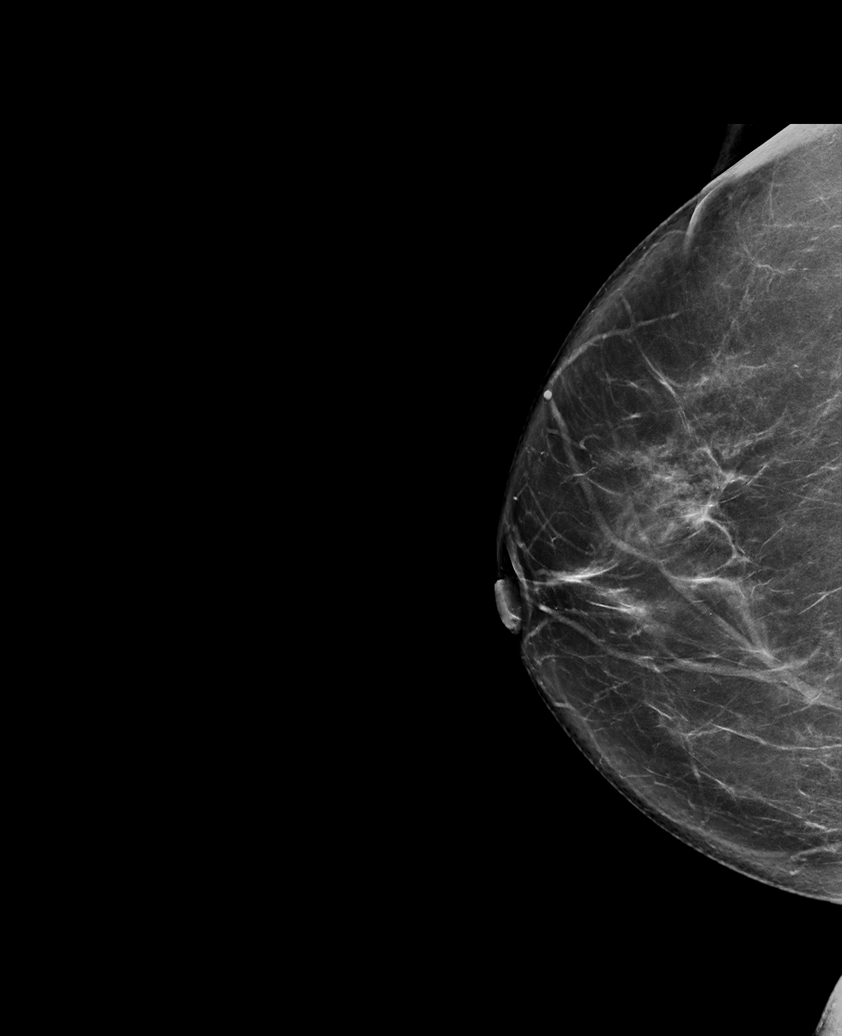

[L CC synth-2D]
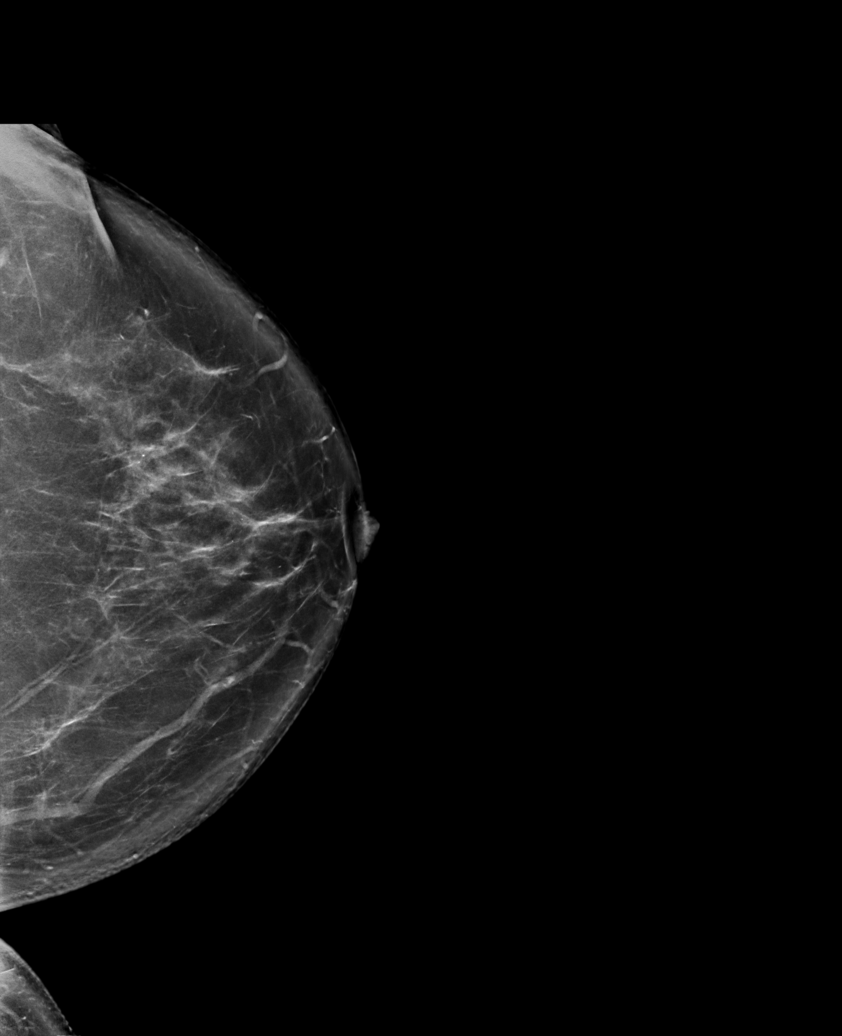

[R MLO synth-2D (1 of 2)]
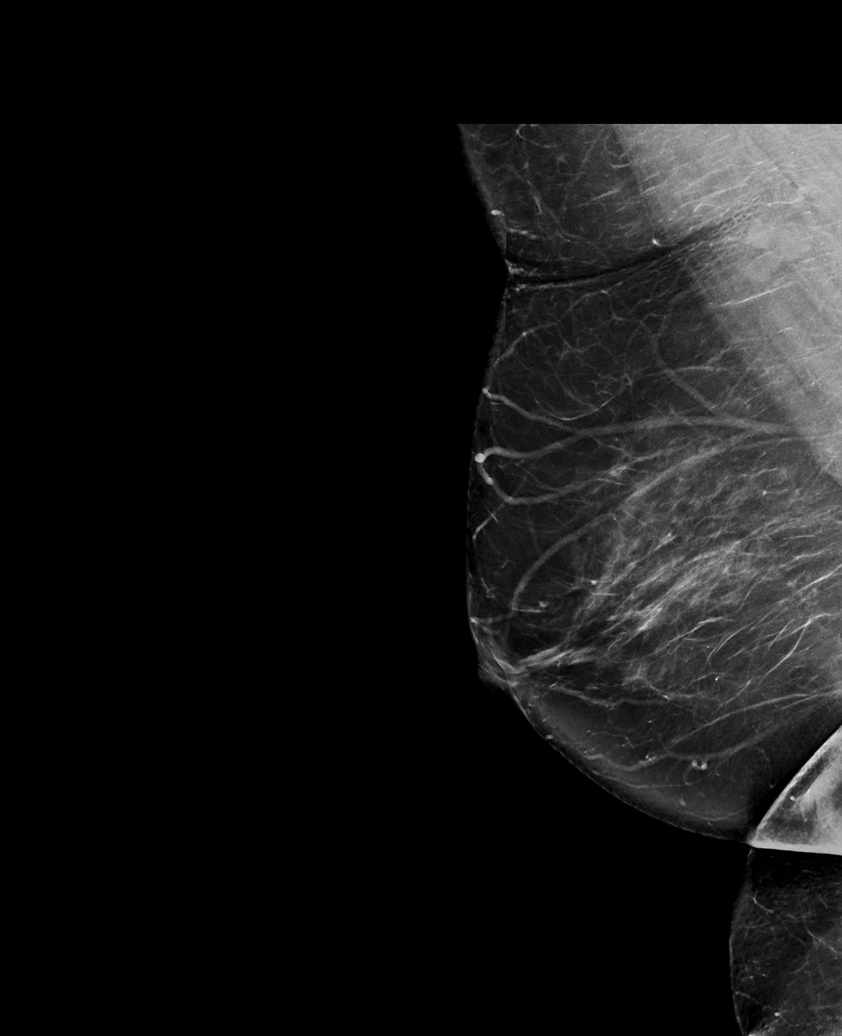

[R MLO synth-2D (2 of 2)]
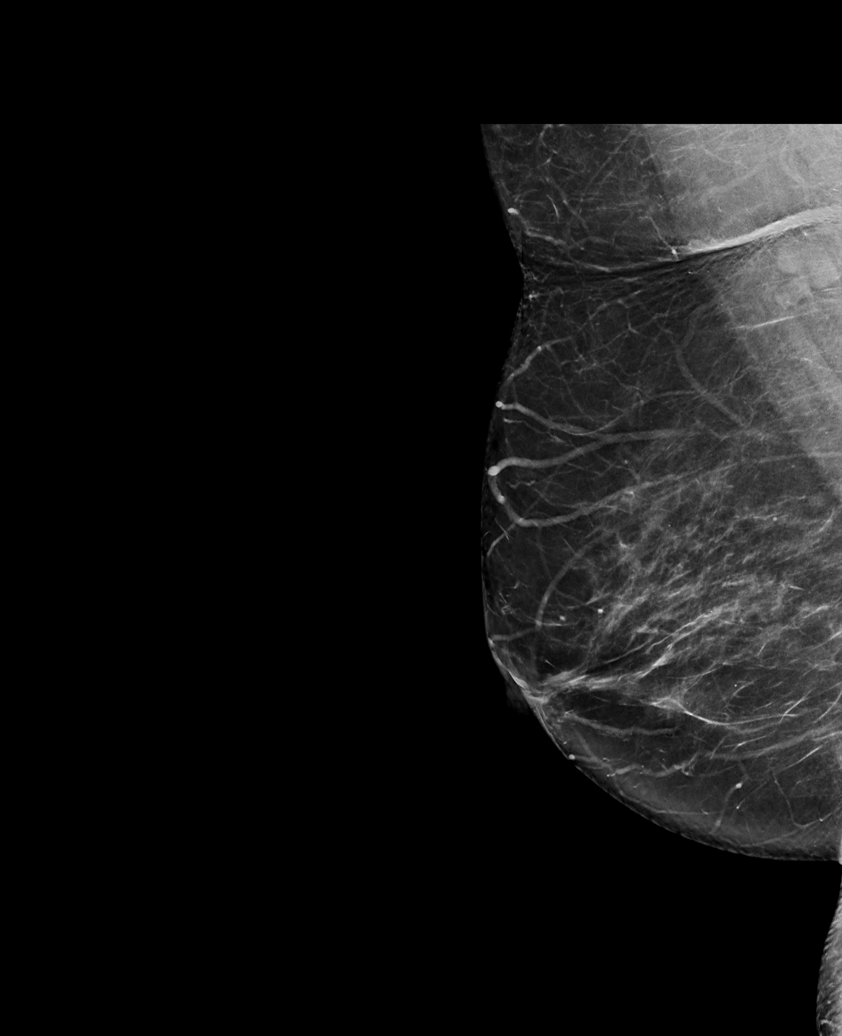

[R CC tomo · tomo slice 41/81.0]
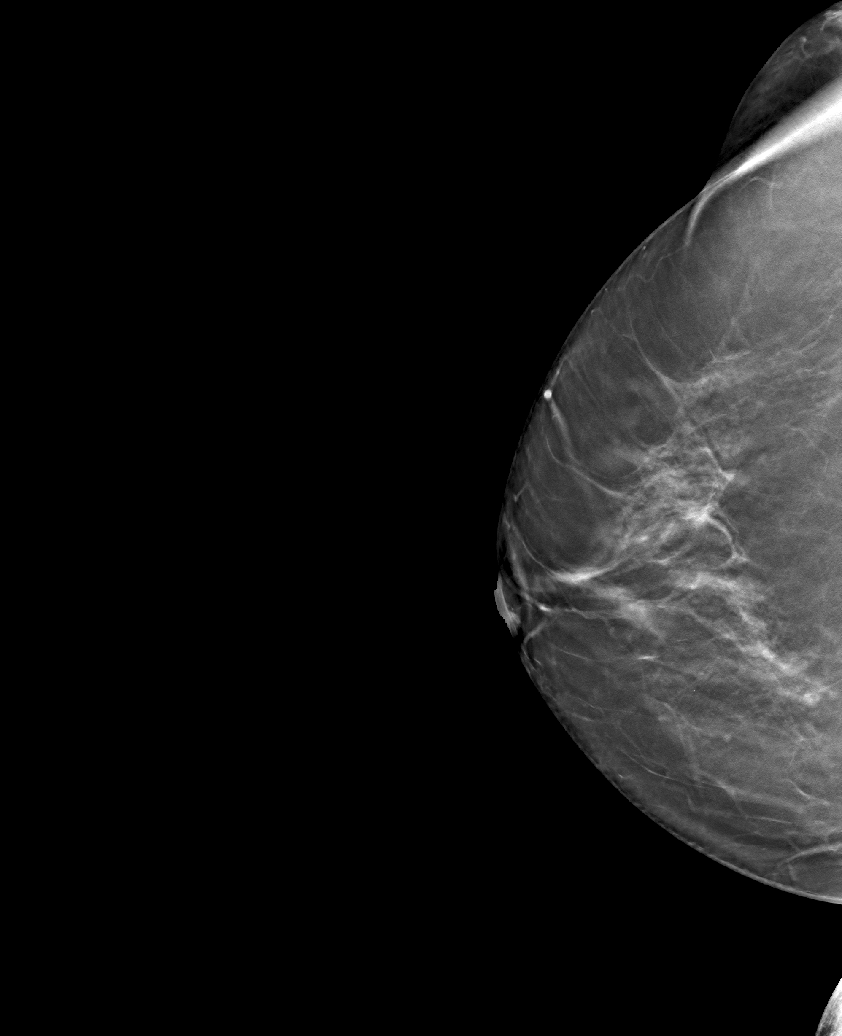

[6 of 30 positions shown; findings below may reference images not displayed]

ACR Breast Density Category b: There are scattered areas of
fibroglandular density.
FINDINGS: There are no findings suspicious for malignancy.
IMPRESSION: No mammographic evidence of malignancy. A result letter of this
screening mammogram will be mailed directly to the patient.

RECOMMENDATION:
Screening mammogram in one year. (Code:51-O-LD2)

BI-RADS CATEGORY  1: Negative.

## 2023-03-06 ENCOUNTER — Other Ambulatory Visit: Payer: Self-pay | Admitting: Physician Assistant

## 2023-03-06 DIAGNOSIS — Z1231 Encounter for screening mammogram for malignant neoplasm of breast: Secondary | ICD-10-CM

## 2023-04-22 ENCOUNTER — Ambulatory Visit
Admission: RE | Admit: 2023-04-22 | Discharge: 2023-04-22 | Disposition: A | Discharge status: Home / Self Care | Payer: 59 | Source: Ambulatory Visit | Attending: Physician Assistant | Admitting: Physician Assistant

## 2023-04-22 DIAGNOSIS — Z1231 Encounter for screening mammogram for malignant neoplasm of breast: Secondary | ICD-10-CM

## 2023-09-13 ENCOUNTER — Encounter (HOSPITAL_COMMUNITY): Payer: Self-pay | Admitting: Emergency Medicine

## 2023-09-13 ENCOUNTER — Ambulatory Visit (HOSPITAL_COMMUNITY)
Admission: EM | Admit: 2023-09-13 | Discharge: 2023-09-13 | Disposition: A | Discharge status: Home / Self Care | Payer: 59 | Attending: Physician Assistant | Admitting: Physician Assistant

## 2023-09-13 DIAGNOSIS — J111 Influenza due to unidentified influenza virus with other respiratory manifestations: Secondary | ICD-10-CM | POA: Diagnosis not present

## 2023-09-13 LAB — POC COVID19/FLU A&B COMBO
Covid Antigen, POC: NEGATIVE
Influenza A Antigen, POC: POSITIVE — AB
Influenza B Antigen, POC: NEGATIVE

## 2023-09-13 MED ORDER — ACETAMINOPHEN 325 MG PO TABS
650.0000 mg | ORAL_TABLET | Freq: Once | ORAL | Status: AC
  Administered 2023-09-13: 650 mg via ORAL

## 2023-09-13 MED ORDER — OSELTAMIVIR PHOSPHATE 75 MG PO CAPS: 75.0000 mg | ORAL_CAPSULE | Freq: Two times a day (BID) | ORAL | 0 refills | Status: AC

## 2023-09-13 MED ORDER — ACETAMINOPHEN 325 MG PO TABS
ORAL_TABLET | ORAL | Status: AC
  Filled 2023-09-13: qty 2

## 2023-09-13 MED ORDER — PROMETHAZINE-DM 6.25-15 MG/5ML PO SYRP: 5.0000 mL | ORAL_SOLUTION | Freq: Four times a day (QID) | ORAL | 0 refills | Status: AC | PRN

## 2023-09-13 MED ORDER — OSELTAMIVIR PHOSPHATE 75 MG PO CAPS: 75.0000 mg | ORAL_CAPSULE | Freq: Two times a day (BID) | ORAL | 0 refills | Status: DC

## 2023-09-13 MED ORDER — ONDANSETRON HCL 4 MG PO TABS: 4.0000 mg | ORAL_TABLET | Freq: Three times a day (TID) | ORAL | 0 refills | Status: AC | PRN

## 2023-09-13 NOTE — ED Provider Notes (Signed)
 [facility]    CSN: [id number] Arrival date & time: 09/13/23  1007      History   Chief Complaint Chief Complaint  Patient presents with   Cough    HPI Lindsay Kelly is a 53 y.o. female.   Patient presents with cough congestion, fever, chills, body aches, nausea.  Reports she recently returned from [place].  Reports symptoms started about 2 and half days ago.  Her family is here with similar symptoms today.  She denies shortness of breath or wheezing.  She has been taking Tylenol  with temporary relief.    Past Medical History:  Diagnosis Date   High cholesterol     Patient Active Problem List   Diagnosis Date Noted   Pure hypercholesterolemia 10/26/2020   Mandible fracture ([facility]) 10/30/2018    Past Surgical History:  Procedure Laterality Date   CESAREAN SECTION     KNEE SURGERY     MANDIBLE FRACTURE SURGERY      OB History   No obstetric history on file.      Home Medications    Prior to Admission medications   Medication Sig Start Date End Date Taking? Authorizing Provider  ondansetron  (ZOFRAN ) 4 MG tablet Take 1 tablet (4 mg total) by mouth every 8 (eight) hours as needed for nausea or vomiting. 09/13/23  Yes Ward, Harlene PEDLAR, PA-C  promethazine -dextromethorphan (PROMETHAZINE -DM) 6.25-15 MG/5ML syrup Take 5 mLs by mouth 4 (four) times daily as needed for cough. 09/13/23  Yes Ward, Harlene PEDLAR, PA-C  acetaminophen  (TYLENOL ) 500 MG tablet Take by mouth.    [provider]  albuterol  (VENTOLIN  HFA) 108 (90 Base) MCG/ACT inhaler Inhale 1-2 puffs into the lungs every 6 (six) hours as needed for wheezing or shortness of breath. 07/10/22   Enedelia Dorna HERO, FNP  atorvastatin (LIPITOR) 10 MG tablet Take 10 mg by mouth daily. 06/18/22   [provider]  benzonatate  (TESSALON ) 100 MG capsule Take 1 capsule (100 mg total) by mouth every 8 (eight) hours. 07/10/22   Enedelia Dorna HERO, FNP  fluticasone (FLONASE) 50 MCG/ACT nasal spray 1  spray in each nostril 06/07/14   [provider]  Guaifenesin  1200 MG TB12 Take 1 tablet (1,200 mg total) by mouth in the morning and at bedtime. 07/10/22   Enedelia Dorna HERO, FNP  oseltamivir  (TAMIFLU ) 75 MG capsule Take 1 capsule (75 mg total) by mouth 2 (two) times daily for 5 days. 09/13/23 09/18/23  Ward, Harlene PEDLAR, PA-C    Family History No family history on file.  Social History Social History   Tobacco Use   Smoking status: Never   Smokeless tobacco: Never     Allergies   Patient has no known allergies.   Review of Systems Review of Systems  Constitutional:  Positive for fever. Negative for chills.  HENT:  Positive for congestion. Negative for ear pain and sore throat.   Eyes:  Negative for pain and visual disturbance.  Respiratory:  Positive for cough. Negative for shortness of breath.   Cardiovascular:  Negative for chest pain and palpitations.  Gastrointestinal:  Negative for abdominal pain and vomiting.  Genitourinary:  Negative for dysuria and hematuria.  Musculoskeletal:  Positive for myalgias. Negative for arthralgias and back pain.  Skin:  Negative for color change and rash.  Neurological:  Negative for seizures and syncope.  All other systems reviewed and are negative.    Physical Exam Triage Vital Signs ED Triage Vitals  Encounter Vitals Group  BP 09/13/23 1053 (!) 144/95     Systolic BP Percentile --      Diastolic BP Percentile --      Pulse Rate 09/13/23 1053 95     Resp 09/13/23 1053 18     Temp 09/13/23 1053 (!) 101.1 F (38.4 C)     Temp Source 09/13/23 1053 Oral     SpO2 09/13/23 1053 92 %     Weight --      Height --      Head Circumference --      Peak Flow --      Pain Score 09/13/23 1051 7     Pain Loc --      Pain Education --      Exclude from Growth Chart --    No data found.  Updated Vital Signs BP (!) 144/95 (BP Location: Left Arm)   Pulse 95   Temp (!) 101.1 F (38.4 C) (Oral)   Resp 18   LMP  (LMP  Unknown)   SpO2 92%   Visual Acuity Right Eye Distance:   Left Eye Distance:   Bilateral Distance:    Right Eye Near:   Left Eye Near:    Bilateral Near:     Physical Exam Vitals and nursing note reviewed.  Constitutional:      General: She is not in acute distress.    Appearance: She is well-developed.  HENT:     Head: Normocephalic and atraumatic.  Eyes:     Conjunctiva/sclera: Conjunctivae normal.  Cardiovascular:     Rate and Rhythm: Normal rate and regular rhythm.     Heart sounds: No murmur heard. Pulmonary:     Effort: Pulmonary effort is normal. No respiratory distress.     Breath sounds: Normal breath sounds.  Abdominal:     Palpations: Abdomen is soft.     Tenderness: There is no abdominal tenderness.  Musculoskeletal:        General: No swelling.     Cervical back: Neck supple.  Skin:    General: Skin is warm and dry.     Capillary Refill: Capillary refill takes less than 2 seconds.  Neurological:     Mental Status: She is alert.  Psychiatric:        Mood and Affect: Mood normal.      UC Treatments / Results  Labs (all labs ordered are listed, but only abnormal results are displayed) Labs Reviewed  POC COVID19/FLU A&B COMBO - Abnormal; Notable for the following components:      Result Value   Influenza A Antigen, POC Positive (*)    All other components within normal limits    EKG   Radiology No results found.  Procedures Procedures (including critical care time)  Medications Ordered in UC Medications  acetaminophen  (TYLENOL ) tablet 650 mg (650 mg Oral Given 09/13/23 1100)    Initial Impression / Assessment and Plan / UC Course  I have reviewed the triage vital signs and the nursing notes.  Pertinent labs & imaging results that were available during my care of the patient were reviewed by me and considered in my medical decision making (see chart for details).     Positive for influenza A here in clinic today.  Will send in Tamiflu .   Lungs clear to auscultation on exam.  O2 between 95 to 97% on recheck.  Patient denies shortness of breath or wheezing.  Supportive care discussed.  Return precautions discussed. Final Clinical Impressions(s) / UC Diagnoses  Final diagnoses:  Influenza with respiratory manifestation     Discharge Instructions      Take Tamiflu  as prescribed    ED Prescriptions     Medication Sig Dispense Auth. Provider   oseltamivir  (TAMIFLU ) 75 MG capsule  (Status: Discontinued) Take 1 capsule (75 mg total) by mouth 2 (two) times daily. 60 capsule Ward, Nachum Derossett Z, PA-C   ondansetron  (ZOFRAN ) 4 MG tablet Take 1 tablet (4 mg total) by mouth every 8 (eight) hours as needed for nausea or vomiting. 20 tablet Ward, Deserae Jennings Z, PA-C   promethazine -dextromethorphan (PROMETHAZINE -DM) 6.25-15 MG/5ML syrup Take 5 mLs by mouth 4 (four) times daily as needed for cough. 118 mL Ward, Harlene PEDLAR, PA-C   oseltamivir  (TAMIFLU ) 75 MG capsule Take 1 capsule (75 mg total) by mouth 2 (two) times daily for 5 days. 10 capsule Ward, Keshayla Schrum Z, PA-C      PDMP not reviewed this encounter.   Ward, Harlene PEDLAR, PA-C 09/13/23 1150

## 2023-09-13 NOTE — ED Triage Notes (Signed)
 Pt had cough for 2 days.  then yesterday having headache, sinus drainage, fevers. Hasn't taken medications for her symptoms. Recently got back from [place].

## 2023-09-13 NOTE — Discharge Instructions (Addendum)
Take Tamiflu as prescribed.

## 2023-12-11 ENCOUNTER — Other Ambulatory Visit (HOSPITAL_COMMUNITY): Payer: Self-pay

## 2023-12-11 MED ORDER — TIRZEPATIDE-WEIGHT MANAGEMENT 2.5 MG/0.5ML ~~LOC~~ SOAJ
2.5000 mg | SUBCUTANEOUS | 0 refills | Status: DC
  Filled 2023-12-11: qty 2, 28d supply, fill #0

## 2024-01-07 ENCOUNTER — Other Ambulatory Visit (HOSPITAL_COMMUNITY): Payer: Self-pay

## 2024-01-08 ENCOUNTER — Other Ambulatory Visit (HOSPITAL_COMMUNITY): Payer: Self-pay

## 2024-01-08 MED ORDER — ZEPBOUND 5 MG/0.5ML ~~LOC~~ SOAJ
5.0000 mg | SUBCUTANEOUS | 1 refills | Status: DC
  Filled 2024-01-08 – 2024-02-05 (×2): qty 2, 28d supply, fill #0

## 2024-01-08 MED ORDER — ZEPBOUND 2.5 MG/0.5ML ~~LOC~~ SOAJ
SUBCUTANEOUS | 0 refills | Status: AC
Start: 2024-01-08 — End: ?
  Filled 2024-01-08: qty 2, 28d supply, fill #0

## 2024-01-09 ENCOUNTER — Other Ambulatory Visit (HOSPITAL_COMMUNITY): Payer: Self-pay

## 2024-02-01 ENCOUNTER — Other Ambulatory Visit (HOSPITAL_COMMUNITY): Payer: Self-pay

## 2024-02-05 ENCOUNTER — Other Ambulatory Visit: Payer: Self-pay

## 2024-02-05 ENCOUNTER — Other Ambulatory Visit (HOSPITAL_COMMUNITY): Payer: Self-pay

## 2024-02-09 ENCOUNTER — Other Ambulatory Visit (HOSPITAL_COMMUNITY): Payer: Self-pay

## 2024-02-23 ENCOUNTER — Other Ambulatory Visit (HOSPITAL_COMMUNITY): Payer: Self-pay

## 2024-03-01 ENCOUNTER — Other Ambulatory Visit (HOSPITAL_COMMUNITY): Payer: Self-pay

## 2024-03-02 ENCOUNTER — Other Ambulatory Visit (HOSPITAL_COMMUNITY): Payer: Self-pay

## 2024-03-04 ENCOUNTER — Other Ambulatory Visit (HOSPITAL_COMMUNITY): Payer: Self-pay

## 2024-03-04 MED ORDER — ZEPBOUND 7.5 MG/0.5ML ~~LOC~~ SOAJ
7.5000 mg | SUBCUTANEOUS | 1 refills | Status: DC
  Filled 2024-03-04: qty 2, 28d supply, fill #0
  Filled 2024-03-28: qty 2, 28d supply, fill #1

## 2024-03-08 ENCOUNTER — Other Ambulatory Visit: Payer: Self-pay | Admitting: Physician Assistant

## 2024-03-08 DIAGNOSIS — Z1231 Encounter for screening mammogram for malignant neoplasm of breast: Secondary | ICD-10-CM

## 2024-04-22 ENCOUNTER — Ambulatory Visit

## 2024-04-23 ENCOUNTER — Other Ambulatory Visit (HOSPITAL_COMMUNITY): Payer: Self-pay

## 2024-04-27 ENCOUNTER — Ambulatory Visit
Admission: RE | Admit: 2024-04-27 | Discharge: 2024-04-27 | Disposition: A | Discharge status: Home / Self Care | Source: Ambulatory Visit | Attending: Physician Assistant | Admitting: Physician Assistant

## 2024-04-27 ENCOUNTER — Other Ambulatory Visit: Payer: Self-pay

## 2024-04-27 ENCOUNTER — Other Ambulatory Visit (HOSPITAL_COMMUNITY): Payer: Self-pay

## 2024-04-27 DIAGNOSIS — Z1231 Encounter for screening mammogram for malignant neoplasm of breast: Secondary | ICD-10-CM

## 2024-04-27 MED ORDER — ESTRADIOL 0.05 MG/24HR TD PTTW
MEDICATED_PATCH | TRANSDERMAL | 3 refills | Status: AC
  Filled 2024-04-27: qty 8, 28d supply, fill #0
  Filled 2024-06-17: qty 8, 28d supply, fill #1
  Filled 2024-07-16: qty 8, 28d supply, fill #2
  Filled 2024-08-15: qty 8, 28d supply, fill #3
  Filled 2024-09-12: qty 8, 28d supply, fill #4
  Filled 2024-10-10: qty 8, 28d supply, fill #5

## 2024-04-27 MED ORDER — ZEPBOUND 7.5 MG/0.5ML ~~LOC~~ SOAJ
SUBCUTANEOUS | 2 refills | Status: AC
  Filled 2024-04-27: qty 2, 28d supply, fill #0
  Filled 2024-05-28: qty 2, 28d supply, fill #1
  Filled 2024-06-26: qty 2, 28d supply, fill #2

## 2024-05-28 ENCOUNTER — Other Ambulatory Visit (HOSPITAL_COMMUNITY): Payer: Self-pay

## 2024-05-29 ENCOUNTER — Other Ambulatory Visit (HOSPITAL_COMMUNITY): Payer: Self-pay

## 2024-06-01 ENCOUNTER — Other Ambulatory Visit (HOSPITAL_COMMUNITY): Payer: Self-pay

## 2024-06-29 ENCOUNTER — Other Ambulatory Visit (HOSPITAL_COMMUNITY): Payer: Self-pay

## 2024-06-29 MED ORDER — ZEPBOUND 7.5 MG/0.5ML ~~LOC~~ SOAJ
7.5000 mg | SUBCUTANEOUS | 2 refills | Status: AC
  Filled 2024-06-29 – 2024-07-22 (×2): qty 2, 28d supply, fill #0
  Filled 2024-08-20: qty 2, 28d supply, fill #1
  Filled 2024-09-12: qty 2, 28d supply, fill #2

## 2024-07-18 ENCOUNTER — Other Ambulatory Visit (HOSPITAL_COMMUNITY): Payer: Self-pay

## 2024-07-21 ENCOUNTER — Other Ambulatory Visit (HOSPITAL_COMMUNITY): Payer: Self-pay

## 2024-07-22 ENCOUNTER — Other Ambulatory Visit (HOSPITAL_COMMUNITY): Payer: Self-pay

## 2024-07-23 ENCOUNTER — Other Ambulatory Visit (HOSPITAL_COMMUNITY): Payer: Self-pay

## 2024-07-24 ENCOUNTER — Other Ambulatory Visit (HOSPITAL_COMMUNITY): Payer: Self-pay

## 2024-08-20 ENCOUNTER — Other Ambulatory Visit (HOSPITAL_COMMUNITY): Payer: Self-pay

## 2024-08-24 ENCOUNTER — Other Ambulatory Visit (HOSPITAL_COMMUNITY): Payer: Self-pay

## 2024-08-24 MED ORDER — ZEPBOUND 7.5 MG/0.5ML ~~LOC~~ SOAJ
7.5000 mg | SUBCUTANEOUS | 4 refills | Status: AC
  Filled 2024-08-24: qty 2, 28d supply, fill #0

## 2024-09-13 ENCOUNTER — Other Ambulatory Visit (HOSPITAL_COMMUNITY): Payer: Self-pay

## 2024-09-14 ENCOUNTER — Other Ambulatory Visit (HOSPITAL_COMMUNITY): Payer: Self-pay

## 2024-09-15 ENCOUNTER — Other Ambulatory Visit (HOSPITAL_COMMUNITY): Payer: Self-pay

## 2024-09-16 ENCOUNTER — Other Ambulatory Visit (HOSPITAL_COMMUNITY): Payer: Self-pay

## 2024-09-17 ENCOUNTER — Other Ambulatory Visit (HOSPITAL_COMMUNITY): Payer: Self-pay

## 2024-10-11 ENCOUNTER — Other Ambulatory Visit (HOSPITAL_COMMUNITY): Payer: Self-pay
